# Patient Record
Sex: Female | Born: 1975 | Race: White | Hispanic: No | Smoking: Current every day smoker
Health system: Southern US, Community
[De-identification: ages and names within clinical notes are randomized; demographics above are authoritative.]

## PROBLEM LIST (undated history)

## (undated) DIAGNOSIS — I10 Essential (primary) hypertension: Secondary | ICD-10-CM

## (undated) HISTORY — PX: APPENDECTOMY: SHX54

## (undated) HISTORY — PX: TUBAL LIGATION: SHX77

---

## 1997-03-21 ENCOUNTER — Inpatient Hospital Stay (HOSPITAL_COMMUNITY): Admission: AD | Admit: 1997-03-21 | Discharge: 1997-03-22 | Payer: Self-pay | Admitting: Obstetrics & Gynecology

## 1997-04-25 ENCOUNTER — Ambulatory Visit (HOSPITAL_COMMUNITY): Admission: RE | Admit: 1997-04-25 | Discharge: 1997-04-25 | Payer: Self-pay | Admitting: Obstetrics

## 1997-05-15 ENCOUNTER — Encounter: Admission: RE | Admit: 1997-05-15 | Discharge: 1997-08-13 | Payer: Self-pay | Admitting: Obstetrics & Gynecology

## 1997-05-20 ENCOUNTER — Inpatient Hospital Stay (HOSPITAL_COMMUNITY): Admission: AD | Admit: 1997-05-20 | Discharge: 1997-05-20 | Payer: Self-pay | Admitting: *Deleted

## 1997-06-03 ENCOUNTER — Ambulatory Visit (HOSPITAL_COMMUNITY): Admission: RE | Admit: 1997-06-03 | Discharge: 1997-06-03 | Payer: Self-pay | Admitting: *Deleted

## 1997-06-09 ENCOUNTER — Inpatient Hospital Stay (HOSPITAL_COMMUNITY): Admission: AD | Admit: 1997-06-09 | Discharge: 1997-06-09 | Payer: Self-pay | Admitting: *Deleted

## 1997-07-05 ENCOUNTER — Inpatient Hospital Stay (HOSPITAL_COMMUNITY): Admission: AD | Admit: 1997-07-05 | Discharge: 1997-07-12 | Payer: Self-pay | Admitting: Obstetrics & Gynecology

## 1997-07-11 ENCOUNTER — Encounter (HOSPITAL_COMMUNITY): Admission: RE | Admit: 1997-07-11 | Discharge: 1997-10-09 | Payer: Self-pay | Admitting: *Deleted

## 1997-07-15 ENCOUNTER — Inpatient Hospital Stay (HOSPITAL_COMMUNITY): Admission: AD | Admit: 1997-07-15 | Discharge: 1997-07-15 | Payer: Self-pay | Admitting: Obstetrics

## 1997-10-31 ENCOUNTER — Encounter (HOSPITAL_COMMUNITY): Admission: RE | Admit: 1997-10-31 | Discharge: 1998-01-29 | Payer: Self-pay | Admitting: *Deleted

## 1998-02-11 ENCOUNTER — Encounter (HOSPITAL_COMMUNITY): Admission: RE | Admit: 1998-02-11 | Discharge: 1998-05-12 | Payer: Self-pay | Admitting: *Deleted

## 1998-05-13 ENCOUNTER — Encounter (HOSPITAL_COMMUNITY): Admission: RE | Admit: 1998-05-13 | Discharge: 1998-08-11 | Payer: Self-pay | Admitting: *Deleted

## 2006-12-15 ENCOUNTER — Emergency Department (HOSPITAL_COMMUNITY): Admission: EM | Admit: 2006-12-15 | Discharge: 2006-12-15 | Payer: Self-pay | Admitting: Emergency Medicine

## 2010-01-30 ENCOUNTER — Emergency Department (HOSPITAL_COMMUNITY)
Admission: EM | Admit: 2010-01-30 | Discharge: 2010-01-31 | Payer: Self-pay | Source: Home / Self Care | Admitting: Emergency Medicine

## 2010-10-22 ENCOUNTER — Encounter: Payer: Self-pay | Admitting: *Deleted

## 2010-10-22 ENCOUNTER — Emergency Department (HOSPITAL_COMMUNITY)
Admission: EM | Admit: 2010-10-22 | Discharge: 2010-10-22 | Disposition: A | Discharge status: Home / Self Care | Payer: Self-pay | Attending: Emergency Medicine | Admitting: Emergency Medicine

## 2010-10-22 DIAGNOSIS — R220 Localized swelling, mass and lump, head: Secondary | ICD-10-CM | POA: Insufficient documentation

## 2010-10-22 DIAGNOSIS — F17200 Nicotine dependence, unspecified, uncomplicated: Secondary | ICD-10-CM | POA: Insufficient documentation

## 2010-10-22 DIAGNOSIS — R221 Localized swelling, mass and lump, neck: Secondary | ICD-10-CM | POA: Insufficient documentation

## 2010-10-22 DIAGNOSIS — E119 Type 2 diabetes mellitus without complications: Secondary | ICD-10-CM | POA: Insufficient documentation

## 2010-10-22 MED ORDER — PENICILLIN V POTASSIUM 250 MG PO TABS
500.0000 mg | ORAL_TABLET | Freq: Once | ORAL | Status: AC
  Administered 2010-10-22: 500 mg via ORAL
  Filled 2010-10-22: qty 2

## 2010-10-22 MED ORDER — PENICILLIN V POTASSIUM 500 MG PO TABS: 500.0000 mg | ORAL_TABLET | Freq: Four times a day (QID) | ORAL | Status: AC

## 2010-10-22 NOTE — ED Provider Notes (Signed)
Medical screening examination/treatment/procedure(s) were performed by non-physician practitioner and as supervising physician I was immediately available for consultation/collaboration.   Laray Anger, DO 10/22/10 2237

## 2010-10-22 NOTE — ED Notes (Signed)
Swelling of lt side of face, now throat feels swollen, no distress.no cough

## 2010-10-22 NOTE — Discharge Instructions (Signed)
Take the antibiotic as directed until gone.  Take ibuprofen up to 800 mg every 8 hrs with food.  Return to ED or see your MD if your symptoms localize or change significantly.

## 2010-10-22 NOTE — ED Notes (Signed)
Pt states woke up this morning with left sided facial swelling from eye to jaw.  Pt denies tooth ache, and feels she has CHF as her father has or she is allergic to "something  exposed to last night".  Pt also states only pain she has is in is her chronic knee.

## 2010-10-22 NOTE — ED Provider Notes (Signed)
History     CSN: [id number] Arrival date & time: 10/22/2010  9:32 PM   Chief Complaint  Patient presents with  . Facial Swelling     (Include location/radiation/quality/duration/timing/severity/associated sxs/prior treatment) HPI Comments: Pt has noted L facial swelling over the past 2 days.  Worse after sleeping.  No fever or trauma.  No mouth, throat or ear pain.  The history is provided by the patient. No language interpreter was used.     Past Medical History  Diagnosis Date  . Diabetes mellitus      Past Surgical History  Procedure Date  . Appendectomy   . Ceas   . Cesarean section   . Tubal ligation     History reviewed. No pertinent family history.  History  Substance Use Topics  . Smoking status: Current Everyday Smoker  . Smokeless tobacco: Not on file  . Alcohol Use: Yes    OB History    Grav Para Term Preterm Abortions TAB SAB Ect Mult Living                  Review of Systems  Constitutional: Negative for fever and chills.  HENT: Negative for ear pain, sore throat and dental problem.   All other systems reviewed and are negative.    Allergies  Contrast media  Home Medications   Current Outpatient Rx  Name Route Sig Dispense Refill  . ALPRAZOLAM 1 MG PO TABS Oral Take 1 mg by mouth 3 (three) times daily as needed. For anxiety     . DIPHENHYDRAMINE HCL 25 MG PO CAPS Oral Take 50 mg by mouth every 4 (four) hours as needed. For allergies       Physical Exam    BP 134/84  Pulse 92  Temp(Src) 98.7 F (37.1 C) (Oral)  Resp 20  Wt 213 lb (96.616 kg)  SpO2 100%  Physical Exam  Nursing note and vitals reviewed. Constitutional: She is oriented to person, place, and time. Vital signs are normal. She appears well-developed and well-nourished. No distress.  HENT:  Head: Normocephalic and atraumatic.  Right Ear: External ear normal.  Left Ear: External ear normal.  Nose: Nose normal.  Mouth/Throat: No oropharyngeal exudate.       i do  not appreciate any swelllig to the face.  Dentition is good.  Pharynx is mildly red and tonsils are slightly enlarged but denies pain.  Ears are normal.  Eyes: Conjunctivae and EOM are normal. Pupils are equal, round, and reactive to light. Right eye exhibits no discharge. Left eye exhibits no discharge. No scleral icterus.  Neck: Normal range of motion. Neck supple. No JVD present. No tracheal deviation present. No thyromegaly present.  Cardiovascular: Normal rate, regular rhythm, normal heart sounds, intact distal pulses and normal pulses.  Exam reveals no gallop and no friction rub.   No murmur heard. Pulmonary/Chest: Effort normal and breath sounds normal. No stridor. No respiratory distress. She has no wheezes. She has no rales. She exhibits no tenderness.  Abdominal: Soft. Normal appearance and bowel sounds are normal. She exhibits no distension and no mass. There is no tenderness. There is no rebound and no guarding.  Musculoskeletal: Normal range of motion. She exhibits no edema and no tenderness.  Lymphadenopathy:       Head (right side): No submental, no submandibular, no tonsillar and no preauricular adenopathy present.       Head (left side): No submental, no submandibular, no tonsillar and no preauricular adenopathy present.  She has no cervical adenopathy.       Right cervical: No deep cervical adenopathy present.      Left cervical: No deep cervical adenopathy present.  Neurological: She is alert and oriented to person, place, and time. She has normal reflexes. No cranial nerve deficit. Coordination normal. GCS eye subscore is 4. GCS verbal subscore is 5. GCS motor subscore is 6.  Skin: Skin is warm and dry. No rash noted. She is not diaphoretic.  Psychiatric: She has a normal mood and affect. Her speech is normal and behavior is normal. Judgment and thought content normal. Cognition and memory are normal.    ED Course  Procedures  No results found for this or any previous  visit. No results found.   No diagnosis found.   MDM        Worthy Rancher, PA 10/22/10 2201

## 2012-10-13 ENCOUNTER — Encounter (HOSPITAL_COMMUNITY): Payer: Self-pay | Admitting: *Deleted

## 2012-10-13 ENCOUNTER — Emergency Department (HOSPITAL_COMMUNITY): Payer: Self-pay

## 2012-10-13 ENCOUNTER — Emergency Department (HOSPITAL_COMMUNITY)
Admission: EM | Admit: 2012-10-13 | Discharge: 2012-10-13 | Disposition: A | Discharge status: Home / Self Care | Payer: Self-pay | Attending: Emergency Medicine | Admitting: Emergency Medicine

## 2012-10-13 DIAGNOSIS — H6690 Otitis media, unspecified, unspecified ear: Secondary | ICD-10-CM | POA: Insufficient documentation

## 2012-10-13 DIAGNOSIS — R062 Wheezing: Secondary | ICD-10-CM | POA: Insufficient documentation

## 2012-10-13 DIAGNOSIS — H9209 Otalgia, unspecified ear: Secondary | ICD-10-CM | POA: Insufficient documentation

## 2012-10-13 DIAGNOSIS — R509 Fever, unspecified: Secondary | ICD-10-CM | POA: Insufficient documentation

## 2012-10-13 DIAGNOSIS — J329 Chronic sinusitis, unspecified: Secondary | ICD-10-CM

## 2012-10-13 DIAGNOSIS — R093 Abnormal sputum: Secondary | ICD-10-CM | POA: Insufficient documentation

## 2012-10-13 DIAGNOSIS — J40 Bronchitis, not specified as acute or chronic: Secondary | ICD-10-CM

## 2012-10-13 DIAGNOSIS — E119 Type 2 diabetes mellitus without complications: Secondary | ICD-10-CM | POA: Insufficient documentation

## 2012-10-13 DIAGNOSIS — R51 Headache: Secondary | ICD-10-CM | POA: Insufficient documentation

## 2012-10-13 DIAGNOSIS — Z7982 Long term (current) use of aspirin: Secondary | ICD-10-CM | POA: Insufficient documentation

## 2012-10-13 DIAGNOSIS — I10 Essential (primary) hypertension: Secondary | ICD-10-CM | POA: Insufficient documentation

## 2012-10-13 DIAGNOSIS — Z79899 Other long term (current) drug therapy: Secondary | ICD-10-CM | POA: Insufficient documentation

## 2012-10-13 DIAGNOSIS — J209 Acute bronchitis, unspecified: Secondary | ICD-10-CM | POA: Insufficient documentation

## 2012-10-13 DIAGNOSIS — J029 Acute pharyngitis, unspecified: Secondary | ICD-10-CM | POA: Insufficient documentation

## 2012-10-13 DIAGNOSIS — F17200 Nicotine dependence, unspecified, uncomplicated: Secondary | ICD-10-CM | POA: Insufficient documentation

## 2012-10-13 HISTORY — DX: Essential (primary) hypertension: I10

## 2012-10-13 MED ORDER — ALBUTEROL SULFATE HFA 108 (90 BASE) MCG/ACT IN AERS
2.0000 | INHALATION_SPRAY | RESPIRATORY_TRACT | Status: DC | PRN
  Administered 2012-10-13: 2 via RESPIRATORY_TRACT
  Filled 2012-10-13: qty 6.7

## 2012-10-13 MED ORDER — PSEUDOEPHEDRINE HCL 60 MG PO TABS: 60.0000 mg | ORAL_TABLET | Freq: Four times a day (QID) | ORAL | Status: AC | PRN

## 2012-10-13 MED ORDER — AMOXICILLIN 500 MG PO CAPS: 500.0000 mg | ORAL_CAPSULE | Freq: Three times a day (TID) | ORAL | Status: AC

## 2012-10-13 MED ORDER — HYDROCODONE-ACETAMINOPHEN 5-325 MG PO TABS: 1.0000 | ORAL_TABLET | ORAL | Status: AC | PRN

## 2012-10-13 NOTE — ED Notes (Signed)
Cough,  nasal congestion, rt ear pain for 4 days

## 2012-10-13 NOTE — ED Provider Notes (Signed)
CSN: [id number]     Arrival date & time 10/13/12  1641 History   None    Chief Complaint  Patient presents with  . Cough   (Consider location/radiation/quality/duration/timing/severity/associated sxs/prior Treatment) Patient is a 37 y.o. female presenting with cough. The history is provided by the patient.  Cough Cough characteristics:  Productive Sputum characteristics:  Yellow Severity:  Moderate Onset quality:  Gradual Duration:  4 days Timing:  Intermittent Progression:  Worsening Chronicity:  New Smoker: yes   Context: sick contacts   Relieved by:  Nothing Worsened by:  Smoking, activity, deep breathing and lying down Ineffective treatments:  Decongestant and cough suppressants Associated symptoms: chills, ear fullness, ear pain, fever, headaches (sinus), sinus congestion, sore throat and wheezing   Associated symptoms: no rash     Past Medical History  Diagnosis Date  . Diabetes mellitus   . Hypertension    Past Surgical History  Procedure Laterality Date  . Appendectomy    . Cesarean section    . Tubal ligation     History reviewed. No pertinent family history. History  Substance Use Topics  . Smoking status: Current Every Day Smoker  . Smokeless tobacco: Not on file  . Alcohol Use: Yes   OB History   Grav Para Term Preterm Abortions TAB SAB Ect Mult Living                 Review of Systems  Constitutional: Positive for fever and chills.  HENT: Positive for ear pain and sore throat.   Eyes: Negative for visual disturbance.  Respiratory: Positive for cough and wheezing.   Gastrointestinal: Negative for nausea, vomiting and abdominal pain.  Skin: Negative for rash.  Neurological: Positive for headaches (sinus).  Psychiatric/Behavioral: The patient is not nervous/anxious.     Allergies  Contrast media  Home Medications   Current Outpatient Rx  Name  Route  Sig  Dispense  Refill  . aspirin 81 MG chewable tablet      81 mg. Chew 81 mg by mouth  daily.         . chlorthalidone (HYGROTON) 25 MG tablet      25 mg. Take 1 tablet (25 mg total) by mouth daily.         . citalopram (CELEXA) 20 MG tablet      20 mg. Take 1 tablet (20 mg total) by mouth daily.         Marland Kitchen lisinopril (PRINIVIL,ZESTRIL) 10 MG tablet      10 mg. Take 1 tablet (10 mg total) by mouth daily.         Marland Kitchen lovastatin (MEVACOR) 40 MG tablet      40 mg. Take 1 tablet (40 mg total) by mouth daily.         . metFORMIN (GLUCOPHAGE) 500 MG tablet   Oral   Take 1,000 mg by mouth 2 (two) times daily.         Marland Kitchen Phenylephrine-DM-GG-APAP (TYLENOL COLD/FLU SEVERE PO)   Oral   Take 10-20 mLs by mouth as needed (for symptoms).         . ALPRAZolam (XANAX) 1 MG tablet   Oral   Take 1 mg by mouth daily as needed (and/or panic). For anxiety          BP 148/79  Pulse 99  Temp(Src) 97.9 F (36.6 C) (Oral)  Resp 17  Ht 4\' 11"  (1.499 m)  Wt 223 lb (101.152 kg)  BMI 45.02 kg/m2  SpO2  98%  LMP 09/29/2012 Physical Exam  Nursing note and vitals reviewed. Constitutional: She is oriented to person, place, and time. She appears well-developed and well-nourished.  HENT:  Head: Normocephalic and atraumatic.  Right Ear: Tympanic membrane is erythematous.  Left Ear: Tympanic membrane is erythematous.  Nose: Right sinus exhibits maxillary sinus tenderness and frontal sinus tenderness. Left sinus exhibits maxillary sinus tenderness and frontal sinus tenderness.  Mouth/Throat: Uvula is midline and mucous membranes are normal. Posterior oropharyngeal erythema present.  Bilateral TM beef red. Maxillary and frontal sinus tenderness.   Eyes: Conjunctivae and EOM are normal.  Neck: Normal range of motion. Neck supple.  Cardiovascular: Normal rate and regular rhythm.   Pulmonary/Chest: Effort normal. She has decreased breath sounds. She has wheezes.  Abdominal: Soft. There is no tenderness.  Musculoskeletal: Normal range of motion.  Lymphadenopathy:    She has  no cervical adenopathy.  Neurological: She is alert and oriented to person, place, and time. No cranial nerve deficit.  Skin: Skin is warm and dry.  Psychiatric: She has a normal mood and affect. Her behavior is normal.    ED Course  Procedures (including critical care time) Labs Review Labs Reviewed - No data to display Imaging Review Dg Chest 2 View  10/13/2012   CLINICAL DATA:  Cough, congestion, and fever.  EXAM: CHEST  2 VIEW  COMPARISON:  Report from 03/29/2006  FINDINGS: Apical lordotic frontal projection observed.  The lungs appear clear. The cardiac and mediastinal contours normal. No pleural effusion identified.  IMPRESSION: No active cardiopulmonary disease.   Electronically Signed   By: Herbie Baltimore   On: 10/13/2012 17:47    MDM  37 y.o. female with cough, congestion, fever ear pain x 4 days. Will treat sinusitis and bronchitis with antibiotics, albuterol inhaler and Sudafed. Patient to follow up with PCP or return here as needed.  I have reviewed this patient's vital signs, nurses notes, appropriate imaging and discussed findings with the patient and plan of care. She voices understanding.    Medication List    TAKE these medications       amoxicillin 500 MG capsule  Commonly known as:  AMOXIL  Take 1 capsule (500 mg total) by mouth 3 (three) times daily.     HYDROcodone-acetaminophen 5-325 MG per tablet  Commonly known as:  NORCO/VICODIN  Take 1 tablet by mouth every 4 (four) hours as needed.     pseudoephedrine 60 MG tablet  Commonly known as:  SUDAFED  Take 1 tablet (60 mg total) by mouth every 6 (six) hours as needed for congestion.      ASK your doctor about these medications       ALPRAZolam 1 MG tablet  Commonly known as:  XANAX  Take 1 mg by mouth daily as needed (and/or panic). For anxiety     aspirin 81 MG chewable tablet  81 mg. Chew 81 mg by mouth daily.     CELEXA 20 MG tablet  Generic drug:  citalopram  20 mg. Take 1 tablet (20 mg total) by  mouth daily.     chlorthalidone 25 MG tablet  Commonly known as:  HYGROTON  25 mg. Take 1 tablet (25 mg total) by mouth daily.     lisinopril 10 MG tablet  Commonly known as:  PRINIVIL,ZESTRIL  10 mg. Take 1 tablet (10 mg total) by mouth daily.     metFORMIN 500 MG tablet  Commonly known as:  GLUCOPHAGE  Take 1,000 mg by mouth 2 (  two) times daily.     MEVACOR 40 MG tablet  Generic drug:  lovastatin  40 mg. Take 1 tablet (40 mg total) by mouth daily.     TYLENOL COLD/FLU SEVERE PO  Take 10-20 mLs by mouth as needed (for symptoms).             [place], [place] 10/13/12 [phone]

## 2012-10-13 NOTE — ED Provider Notes (Signed)
Medical screening examination/treatment/procedure(s) were performed by non-physician practitioner and as supervising physician I was immediately available for consultation/collaboration.   Keonta Alsip, MD 10/13/12 2329 

## 2013-11-09 ENCOUNTER — Emergency Department (HOSPITAL_COMMUNITY)
Admission: EM | Admit: 2013-11-09 | Discharge: 2013-11-10 | Disposition: A | Discharge status: Home / Self Care | Payer: PRIVATE HEALTH INSURANCE | Attending: Emergency Medicine | Admitting: Emergency Medicine

## 2013-11-09 ENCOUNTER — Encounter (HOSPITAL_COMMUNITY): Payer: Self-pay | Admitting: Emergency Medicine

## 2013-11-09 ENCOUNTER — Emergency Department (HOSPITAL_COMMUNITY): Payer: PRIVATE HEALTH INSURANCE

## 2013-11-09 DIAGNOSIS — Z792 Long term (current) use of antibiotics: Secondary | ICD-10-CM | POA: Diagnosis not present

## 2013-11-09 DIAGNOSIS — R51 Headache: Secondary | ICD-10-CM | POA: Insufficient documentation

## 2013-11-09 DIAGNOSIS — Z79899 Other long term (current) drug therapy: Secondary | ICD-10-CM | POA: Diagnosis not present

## 2013-11-09 DIAGNOSIS — Z720 Tobacco use: Secondary | ICD-10-CM | POA: Diagnosis not present

## 2013-11-09 DIAGNOSIS — I10 Essential (primary) hypertension: Secondary | ICD-10-CM | POA: Diagnosis not present

## 2013-11-09 DIAGNOSIS — E119 Type 2 diabetes mellitus without complications: Secondary | ICD-10-CM | POA: Insufficient documentation

## 2013-11-09 DIAGNOSIS — Z3202 Encounter for pregnancy test, result negative: Secondary | ICD-10-CM | POA: Insufficient documentation

## 2013-11-09 DIAGNOSIS — Z7982 Long term (current) use of aspirin: Secondary | ICD-10-CM | POA: Diagnosis not present

## 2013-11-09 DIAGNOSIS — R519 Headache, unspecified: Secondary | ICD-10-CM

## 2013-11-09 LAB — POC URINE PREG, ED: PREG TEST UR: NEGATIVE

## 2013-11-09 MED ORDER — DIPHENHYDRAMINE HCL 50 MG/ML IJ SOLN
25.0000 mg | Freq: Once | INTRAMUSCULAR | Status: AC
  Administered 2013-11-10: 25 mg via INTRAVENOUS
  Filled 2013-11-09: qty 1

## 2013-11-09 MED ORDER — DEXAMETHASONE SODIUM PHOSPHATE 10 MG/ML IJ SOLN
10.0000 mg | Freq: Once | INTRAMUSCULAR | Status: AC
  Administered 2013-11-10: 10 mg via INTRAVENOUS
  Filled 2013-11-09: qty 1

## 2013-11-09 MED ORDER — SODIUM CHLORIDE 0.9 % IV BOLUS (SEPSIS)
1000.0000 mL | Freq: Once | INTRAVENOUS | Status: AC
  Administered 2013-11-10: 1000 mL via INTRAVENOUS

## 2013-11-09 MED ORDER — METOCLOPRAMIDE HCL 5 MG/ML IJ SOLN
10.0000 mg | Freq: Once | INTRAMUSCULAR | Status: AC
  Administered 2013-11-10: 10 mg via INTRAVENOUS
  Filled 2013-11-09: qty 2

## 2013-11-09 MED ORDER — KETOROLAC TROMETHAMINE 30 MG/ML IJ SOLN
30.0000 mg | Freq: Once | INTRAMUSCULAR | Status: AC
  Administered 2013-11-10: 30 mg via INTRAVENOUS
  Filled 2013-11-09: qty 1

## 2013-11-09 NOTE — ED Notes (Signed)
Headache, vomited x2,  Pain rt eye with d/c. Alert,

## 2013-11-09 NOTE — ED Notes (Signed)
Initial Contact - pt states that right eye pain began two days ago and that it has "spread and now her head has been throbbing for the last 7-8 hours." Pt states that she took ibuprofen, BC powder, and aleve with no relief. Pt states that she has is having blurriness in the right eye, sensitivity to noise and light, and some dizziness. Pt states that she has a hx of high bp but has never had symptoms like these before. Pt has slight swelling/redness around the right eye. Pt VSS, NAD.

## 2013-11-09 NOTE — ED Provider Notes (Signed)
CSN: [id number]     Arrival date & time 11/09/13  2059 History   This chart was scribed for Geoffery Lyons, MD by Bronson Curb, ED Scribe. This patient was seen in room [facility]04/[facility]04 and the patient's care was started at 11:19 PM.    Chief Complaint  Patient presents with  . Headache    The history is provided by the patient. No language interpreter was used.    HPI Comments: Katherine Mcdonald is a 38 y.o. female, with history of DM and HTN, who presents to the Emergency Department complaining of throbbing, frontal HA that radiates to the top of the head for the last 7-8 hours. There is associated right eye swelling and blurred vision in right eye. Patient states she is having difficulty "saying what she wants to say" and saying the words come out "scrambled". She denies any head trauma, recent cold/flu symptoms. She deneis history of HA's. She reports she has not had a period in 3 months, but denies chance of pregnancy due to tubal ligation.  Past Medical History  Diagnosis Date  . Diabetes mellitus   . Hypertension    Past Surgical History  Procedure Laterality Date  . Appendectomy    . Cesarean section    . Tubal ligation     History reviewed. No pertinent family history. History  Substance Use Topics  . Smoking status: Current Every Day Smoker  . Smokeless tobacco: Not on file  . Alcohol Use: Yes   OB History   Grav Para Term Preterm Abortions TAB SAB Ect Mult Living                 Review of Systems  Eyes: Positive for pain and visual disturbance.  Neurological: Positive for headaches.  All other systems reviewed and are negative.     Allergies  Contrast media  Home Medications   Prior to Admission medications   Medication Sig Start Date End Date Taking? Authorizing Provider  ALPRAZolam Prudy Feeler) 1 MG tablet Take 1 mg by mouth daily as needed (and/or panic). For anxiety    Historical Provider, MD  amoxicillin (AMOXIL) 500 MG capsule Take 1 capsule (500 mg total)  by mouth 3 (three) times daily. 10/13/12   Hope Orlene Och, NP  aspirin 81 MG chewable tablet 81 mg. Chew 81 mg by mouth daily.    Historical Provider, MD  chlorthalidone (HYGROTON) 25 MG tablet 25 mg. Take 1 tablet (25 mg total) by mouth daily. 07/28/12 07/28/13  Historical Provider, MD  citalopram (CELEXA) 20 MG tablet 20 mg. Take 1 tablet (20 mg total) by mouth daily. 06/28/12 06/28/13  Historical Provider, MD  HYDROcodone-acetaminophen (NORCO/VICODIN) 5-325 MG per tablet Take 1 tablet by mouth every 4 (four) hours as needed. 10/13/12   Hope Orlene Och, NP  lisinopril (PRINIVIL,ZESTRIL) 10 MG tablet 10 mg. Take 1 tablet (10 mg total) by mouth daily. 06/28/12 06/28/13  Historical Provider, MD  lovastatin (MEVACOR) 40 MG tablet 40 mg. Take 1 tablet (40 mg total) by mouth daily. 08/02/12 08/02/13  Historical Provider, MD  metFORMIN (GLUCOPHAGE) 500 MG tablet Take 1,000 mg by mouth 2 (two) times daily.    Historical Provider, MD  Phenylephrine-DM-GG-APAP (TYLENOL COLD/FLU SEVERE PO) Take 10-20 mLs by mouth as needed (for symptoms).    Historical Provider, MD  pseudoephedrine (SUDAFED) 60 MG tablet Take 1 tablet (60 mg total) by mouth every 6 (six) hours as needed for congestion. 10/13/12   Hope Orlene Och, NP   Triage Vitals:  BP 136/66  Pulse 85  Temp(Src) 97.9 F (36.6 C) (Oral)  Ht 4\' 11"  (1.499 m)  Wt 223 lb (101.152 kg)  BMI 45.02 kg/m2  SpO2 99%  LMP 08/09/2013  Physical Exam  Nursing note and vitals reviewed. Constitutional: She is oriented to person, place, and time. She appears well-developed and well-nourished. No distress.  HENT:  Head: Normocephalic and atraumatic.  Mouth/Throat: Oropharynx is clear and moist.  Eyes: Conjunctivae and EOM are normal. Pupils are equal, round, and reactive to light.  No papilledema.  Neck: Normal range of motion. Neck supple. No tracheal deviation present.  Cardiovascular: Normal rate, regular rhythm and normal heart sounds.   No murmur heard. Pulmonary/Chest: Effort  normal and breath sounds normal. No respiratory distress. She has no wheezes. She has no rales.  Musculoskeletal: Normal range of motion.  Neurological: She is alert and oriented to person, place, and time. No cranial nerve deficit. She exhibits normal muscle tone. Coordination normal.  Skin: Skin is warm and dry. She is not diaphoretic.  Psychiatric: She has a normal mood and affect. Her behavior is normal.    ED Course  Procedures (including critical care time)  DIAGNOSTIC STUDIES: Oxygen Saturation is 100% on room air, normal by my interpretation.    COORDINATION OF CARE: At 2323 Discussed treatment plan with patient which includes migraine cocktail. Patient agrees.   Labs Review Labs Reviewed  POC URINE PREG, ED    Imaging Review No results found.   EKG Interpretation None      MDM   Final diagnoses:  None    Patient presents with complaints of headache since this afternoon. Her neurologic exam is nonfocal and CT scan of the head is unremarkable. I highly doubt an emergent situation and do not feel as though an LP is indicated at this time. She was given a migraine cocktail and appears to be feeling better. Her laboratory studies are essentially unremarkable and I feel as though she is appropriate for discharge. She is to continue taking ibuprofen as needed and return to the emergency department if she develops any problems.  I personally performed the services described in this documentation, which was scribed in my presence. The recorded information has been reviewed and is accurate.      Geoffery Lyonsouglas Fareed Fung, MD 11/10/13 [phone]

## 2013-11-10 LAB — BASIC METABOLIC PANEL
ANION GAP: 14 (ref 5–15)
BUN: 10 mg/dL (ref 6–23)
CHLORIDE: 104 meq/L (ref 96–112)
CO2: 24 meq/L (ref 19–32)
Calcium: 9 mg/dL (ref 8.4–10.5)
Creatinine, Ser: 0.74 mg/dL (ref 0.50–1.10)
GFR calc Af Amer: >90 mL/min (ref >90)
GFR calc non Af Amer: >90 mL/min (ref >90)
GLUCOSE: 177 mg/dL — AB (ref 70–99)
POTASSIUM: 3.9 meq/L (ref 3.7–5.3)
SODIUM: 142 meq/L (ref 137–147)

## 2013-11-10 LAB — CBC WITH DIFFERENTIAL/PLATELET
BASOS ABS: 0 10*3/uL (ref 0.0–0.1)
Basophils Relative: 0 % (ref 0–1)
EOS PCT: 5 % (ref 0–5)
Eosinophils Absolute: 0.5 10*3/uL (ref 0.0–0.7)
HCT: 40.5 % (ref 36.0–46.0)
Hemoglobin: 14.3 g/dL (ref 12.0–15.0)
LYMPHS ABS: 2.9 10*3/uL (ref 0.7–4.0)
LYMPHS PCT: 26 % (ref 12–46)
MCH: 33.5 pg (ref 26.0–34.0)
MCHC: 35.3 g/dL (ref 30.0–36.0)
MCV: 94.8 fL (ref 78.0–100.0)
Monocytes Absolute: 0.7 10*3/uL (ref 0.1–1.0)
Monocytes Relative: 7 % (ref 3–12)
NEUTROS ABS: 7 10*3/uL (ref 1.7–7.7)
NEUTROS PCT: 62 % (ref 43–77)
PLATELETS: 292 10*3/uL (ref 150–400)
RBC: 4.27 MIL/uL (ref 3.87–5.11)
RDW: 12.4 % (ref 11.5–15.5)
WBC: 11.1 10*3/uL — AB (ref 4.0–10.5)

## 2013-11-10 NOTE — Discharge Instructions (Signed)
Ibuprofen 600 mg every 6 hours as needed for pain.  Follow up with your primary Dr. if not improving in the next several days, and return to the ER if you develop a significant worsening of your headache or other new and concerning symptoms.   General Headache Without Cause A headache is pain or discomfort felt around the head or neck area. The specific cause of a headache may not be found. There are many causes and types of headaches. A few common ones are:  Tension headaches.  Migraine headaches.  Cluster headaches.  Chronic daily headaches. HOME CARE INSTRUCTIONS   Keep all follow-up appointments with your caregiver or any specialist referral.  Only take over-the-counter or prescription medicines for pain or discomfort as directed by your caregiver.  Lie down in a dark, quiet room when you have a headache.  Keep a headache journal to find out what may trigger your migraine headaches. For example, write down:  What you eat and drink.  How much sleep you get.  Any change to your diet or medicines.  Try massage or other relaxation techniques.  Put ice packs or heat on the head and neck. Use these 3 to 4 times per day for 15 to 20 minutes each time, or as needed.  Limit stress.  Sit up straight, and do not tense your muscles.  Quit smoking if you smoke.  Limit alcohol use.  Decrease the amount of caffeine you drink, or stop drinking caffeine.  Eat and sleep on a regular schedule.  Get 7 to 9 hours of sleep, or as recommended by your caregiver.  Keep lights dim if bright lights bother you and make your headaches worse. SEEK MEDICAL CARE IF:   You have problems with the medicines you were prescribed.  Your medicines are not working.  You have a change from the usual headache.  You have nausea or vomiting. SEEK IMMEDIATE MEDICAL CARE IF:   Your headache becomes severe.  You have a fever.  You have a stiff neck.  You have loss of vision.  You have  muscular weakness or loss of muscle control.  You start losing your balance or have trouble walking.  You feel faint or pass out.  You have severe symptoms that are different from your first symptoms. MAKE SURE YOU:   Understand these instructions.  Will watch your condition.  Will get help right away if you are not doing well or get worse. Document Released: 01/25/2005 Document Revised: 04/19/2011 Document Reviewed: 02/10/2011 [facility] Patient Information 2015 [employer], [place]. This information is not intended to replace advice given to you by your health care provider. Make sure you discuss any questions you have with your health care provider.

## 2013-11-19 ENCOUNTER — Emergency Department (HOSPITAL_COMMUNITY): Payer: No Typology Code available for payment source

## 2013-11-19 ENCOUNTER — Encounter (HOSPITAL_COMMUNITY): Payer: Self-pay | Admitting: Emergency Medicine

## 2013-11-19 ENCOUNTER — Emergency Department (HOSPITAL_COMMUNITY)
Admission: EM | Admit: 2013-11-19 | Discharge: 2013-11-19 | Disposition: A | Discharge status: Home / Self Care | Payer: No Typology Code available for payment source | Attending: Emergency Medicine | Admitting: Emergency Medicine

## 2013-11-19 DIAGNOSIS — Z7982 Long term (current) use of aspirin: Secondary | ICD-10-CM | POA: Insufficient documentation

## 2013-11-19 DIAGNOSIS — I10 Essential (primary) hypertension: Secondary | ICD-10-CM | POA: Diagnosis not present

## 2013-11-19 DIAGNOSIS — S3992XA Unspecified injury of lower back, initial encounter: Secondary | ICD-10-CM | POA: Insufficient documentation

## 2013-11-19 DIAGNOSIS — Y92410 Unspecified street and highway as the place of occurrence of the external cause: Secondary | ICD-10-CM | POA: Diagnosis not present

## 2013-11-19 DIAGNOSIS — M545 Low back pain: Secondary | ICD-10-CM

## 2013-11-19 DIAGNOSIS — E119 Type 2 diabetes mellitus without complications: Secondary | ICD-10-CM | POA: Insufficient documentation

## 2013-11-19 DIAGNOSIS — S199XXA Unspecified injury of neck, initial encounter: Secondary | ICD-10-CM | POA: Diagnosis not present

## 2013-11-19 DIAGNOSIS — Y9389 Activity, other specified: Secondary | ICD-10-CM | POA: Insufficient documentation

## 2013-11-19 DIAGNOSIS — Z79899 Other long term (current) drug therapy: Secondary | ICD-10-CM | POA: Insufficient documentation

## 2013-11-19 DIAGNOSIS — Z792 Long term (current) use of antibiotics: Secondary | ICD-10-CM | POA: Diagnosis not present

## 2013-11-19 DIAGNOSIS — M542 Cervicalgia: Secondary | ICD-10-CM

## 2013-11-19 MED ORDER — OXYCODONE-ACETAMINOPHEN 5-325 MG PO TABS
1.0000 | ORAL_TABLET | Freq: Once | ORAL | Status: AC
  Administered 2013-11-19: 1 via ORAL
  Filled 2013-11-19: qty 1

## 2013-11-19 MED ORDER — CYCLOBENZAPRINE HCL 10 MG PO TABS: 10.0000 mg | ORAL_TABLET | Freq: Two times a day (BID) | ORAL | Status: AC | PRN

## 2013-11-19 MED ORDER — NAPROXEN SODIUM 220 MG PO TABS: 220.0000 mg | ORAL_TABLET | Freq: Two times a day (BID) | ORAL | Status: AC
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
## 2021-12-06 DIAGNOSIS — R079 Chest pain, unspecified: Secondary | ICD-10-CM | POA: Diagnosis not present

## 2021-12-06 DIAGNOSIS — E1165 Type 2 diabetes mellitus with hyperglycemia: Secondary | ICD-10-CM | POA: Diagnosis not present

## 2021-12-06 DIAGNOSIS — Z20822 Contact with and (suspected) exposure to covid-19: Secondary | ICD-10-CM | POA: Diagnosis not present

## 2021-12-06 DIAGNOSIS — R7309 Other abnormal glucose: Secondary | ICD-10-CM | POA: Diagnosis not present

## 2021-12-06 DIAGNOSIS — R0789 Other chest pain: Secondary | ICD-10-CM | POA: Diagnosis not present

## 2021-12-07 DIAGNOSIS — R079 Chest pain, unspecified: Secondary | ICD-10-CM | POA: Diagnosis not present

## 2021-12-07 DIAGNOSIS — R000 Tachycardia, unspecified: Secondary | ICD-10-CM | POA: Diagnosis not present

## 2022-06-09 DEATH — deceased
